# Patient Record
Sex: Female | Born: 1955
Health system: Southern US, Community
[De-identification: ages and names within clinical notes are randomized; demographics above are authoritative.]

---

## 1998-09-14 ENCOUNTER — Inpatient Hospital Stay (HOSPITAL_COMMUNITY): Admission: RE | Admit: 1998-09-14 | Discharge: 1998-09-14 | Payer: Self-pay | Admitting: *Deleted

## 1998-11-29 ENCOUNTER — Ambulatory Visit (HOSPITAL_COMMUNITY): Admission: RE | Admit: 1998-11-29 | Discharge: 1998-11-29 | Payer: Self-pay | Admitting: Obstetrics and Gynecology

## 1999-02-12 ENCOUNTER — Inpatient Hospital Stay (HOSPITAL_COMMUNITY): Admission: AD | Admit: 1999-02-12 | Discharge: 1999-02-14 | Payer: Self-pay | Admitting: Obstetrics and Gynecology

## 2001-06-20 ENCOUNTER — Other Ambulatory Visit: Admission: RE | Admit: 2001-06-20 | Discharge: 2001-06-20 | Payer: Self-pay | Admitting: Obstetrics and Gynecology

## 2001-08-08 ENCOUNTER — Ambulatory Visit (HOSPITAL_COMMUNITY): Admission: RE | Admit: 2001-08-08 | Discharge: 2001-08-08 | Payer: Self-pay | Admitting: Obstetrics and Gynecology

## 2001-08-08 ENCOUNTER — Encounter: Payer: Self-pay | Admitting: Obstetrics and Gynecology

## 2002-07-17 ENCOUNTER — Other Ambulatory Visit: Admission: RE | Admit: 2002-07-17 | Discharge: 2002-07-17 | Payer: Self-pay | Admitting: Obstetrics and Gynecology

## 2002-09-23 ENCOUNTER — Ambulatory Visit (HOSPITAL_COMMUNITY): Admission: RE | Admit: 2002-09-23 | Discharge: 2002-09-23 | Payer: Self-pay | Admitting: Obstetrics and Gynecology

## 2002-09-23 ENCOUNTER — Encounter: Payer: Self-pay | Admitting: Obstetrics and Gynecology

## 2003-12-22 ENCOUNTER — Ambulatory Visit (HOSPITAL_COMMUNITY): Admission: RE | Admit: 2003-12-22 | Discharge: 2003-12-22 | Payer: Self-pay | Admitting: Obstetrics and Gynecology

## 2004-01-27 ENCOUNTER — Other Ambulatory Visit: Admission: RE | Admit: 2004-01-27 | Discharge: 2004-01-27 | Payer: Self-pay | Admitting: Obstetrics and Gynecology

## 2004-12-30 ENCOUNTER — Ambulatory Visit (HOSPITAL_COMMUNITY): Admission: RE | Admit: 2004-12-30 | Discharge: 2004-12-30 | Payer: Self-pay | Admitting: Obstetrics and Gynecology

## 2005-03-31 ENCOUNTER — Other Ambulatory Visit: Admission: RE | Admit: 2005-03-31 | Discharge: 2005-03-31 | Payer: Self-pay | Admitting: Internal Medicine

## 2006-01-22 ENCOUNTER — Ambulatory Visit (HOSPITAL_COMMUNITY): Admission: RE | Admit: 2006-01-22 | Discharge: 2006-01-22 | Payer: Self-pay | Admitting: Internal Medicine

## 2006-04-11 ENCOUNTER — Encounter: Payer: Self-pay | Admitting: *Deleted

## 2006-08-10 ENCOUNTER — Other Ambulatory Visit: Admission: RE | Admit: 2006-08-10 | Discharge: 2006-08-10 | Payer: Self-pay | Admitting: Internal Medicine

## 2007-01-30 ENCOUNTER — Ambulatory Visit (HOSPITAL_COMMUNITY): Admission: RE | Admit: 2007-01-30 | Discharge: 2007-01-30 | Payer: Self-pay | Admitting: *Deleted

## 2007-09-16 ENCOUNTER — Other Ambulatory Visit: Admission: RE | Admit: 2007-09-16 | Discharge: 2007-09-16 | Payer: Self-pay | Admitting: Family Medicine

## 2008-02-06 ENCOUNTER — Ambulatory Visit (HOSPITAL_COMMUNITY): Admission: RE | Admit: 2008-02-06 | Discharge: 2008-02-06 | Payer: Self-pay | Admitting: *Deleted

## 2008-09-02 ENCOUNTER — Ambulatory Visit (HOSPITAL_COMMUNITY): Admission: RE | Admit: 2008-09-02 | Discharge: 2008-09-02 | Payer: Self-pay | Admitting: *Deleted

## 2008-10-13 ENCOUNTER — Other Ambulatory Visit: Admission: RE | Admit: 2008-10-13 | Discharge: 2008-10-13 | Payer: Self-pay | Admitting: Internal Medicine

## 2009-02-09 ENCOUNTER — Ambulatory Visit (HOSPITAL_COMMUNITY): Admission: RE | Admit: 2009-02-09 | Discharge: 2009-02-09 | Payer: Self-pay | Admitting: Internal Medicine

## 2009-10-18 ENCOUNTER — Other Ambulatory Visit: Admission: RE | Admit: 2009-10-18 | Discharge: 2009-10-18 | Payer: Self-pay | Admitting: Internal Medicine

## 2009-10-25 ENCOUNTER — Inpatient Hospital Stay (HOSPITAL_COMMUNITY): Admission: AD | Admit: 2009-10-25 | Discharge: 2009-10-26 | Payer: Self-pay | Admitting: Obstetrics & Gynecology

## 2009-11-17 ENCOUNTER — Other Ambulatory Visit: Admission: RE | Admit: 2009-11-17 | Discharge: 2009-11-17 | Payer: Self-pay | Admitting: Obstetrics and Gynecology

## 2009-11-17 ENCOUNTER — Ambulatory Visit: Payer: Self-pay | Admitting: Obstetrics and Gynecology

## 2009-12-01 ENCOUNTER — Ambulatory Visit: Payer: Self-pay | Admitting: Obstetrics and Gynecology

## 2010-03-15 ENCOUNTER — Ambulatory Visit (HOSPITAL_COMMUNITY): Admission: RE | Admit: 2010-03-15 | Discharge: 2010-03-15 | Payer: Self-pay | Admitting: Internal Medicine

## 2010-06-01 ENCOUNTER — Ambulatory Visit: Payer: Self-pay | Admitting: Obstetrics and Gynecology

## 2010-11-10 ENCOUNTER — Other Ambulatory Visit
Admission: RE | Admit: 2010-11-10 | Discharge: 2010-11-10 | Payer: Self-pay | Source: Home / Self Care | Admitting: Registered Nurse

## 2011-02-27 ENCOUNTER — Other Ambulatory Visit (HOSPITAL_COMMUNITY): Payer: Self-pay | Admitting: Internal Medicine

## 2011-02-27 DIAGNOSIS — Z1231 Encounter for screening mammogram for malignant neoplasm of breast: Secondary | ICD-10-CM

## 2011-03-01 ENCOUNTER — Other Ambulatory Visit: Payer: Self-pay | Admitting: Neurosurgery

## 2011-03-01 DIAGNOSIS — M549 Dorsalgia, unspecified: Secondary | ICD-10-CM

## 2011-03-10 ENCOUNTER — Other Ambulatory Visit: Payer: 59

## 2011-03-14 LAB — POCT PREGNANCY, URINE: Preg Test, Ur: NEGATIVE

## 2011-03-15 LAB — CBC
Hemoglobin: 12.9 g/dL (ref 12.0–15.0)
MCHC: 33.3 g/dL (ref 30.0–36.0)
RBC: 4.15 MIL/uL (ref 3.87–5.11)
RDW: 12.3 % (ref 11.5–15.5)
WBC: 6.6 10*3/uL (ref 4.0–10.5)

## 2011-03-15 LAB — SAMPLE TO BLOOD BANK

## 2011-03-21 ENCOUNTER — Ambulatory Visit (HOSPITAL_COMMUNITY): Payer: 59

## 2011-04-04 ENCOUNTER — Ambulatory Visit (HOSPITAL_COMMUNITY)
Admission: RE | Admit: 2011-04-04 | Discharge: 2011-04-04 | Disposition: A | Discharge status: Home / Self Care | Payer: 59 | Source: Ambulatory Visit | Attending: Internal Medicine | Admitting: Internal Medicine

## 2011-04-04 DIAGNOSIS — Z1231 Encounter for screening mammogram for malignant neoplasm of breast: Secondary | ICD-10-CM | POA: Insufficient documentation

## 2011-04-15 IMAGING — US US PELVIS COMPLETE MODIFY
1 series · 13 of 25 positions shown · non-contrast
Comparison: None.

CLINICAL DATA: Vaginal bleeding.  Polyp identified on physical
examination at the cervical loss.  LMP 10/19/2009.

TRANSABDOMINAL AND TRANSVAGINAL ULTRASOUND OF PELVIS 10/25/2009:
TECHNIQUE: Both transabdominal and transvaginal ultrasound
examinations of the pelvis were performed including evaluation of
the uterus, ovaries, adnexal regions, and pelvic cul-de-sac.

[Series 1: us transvaginal non-ob · 0.27mm/px · 13 of 47 slices shown]
[im 1/47]
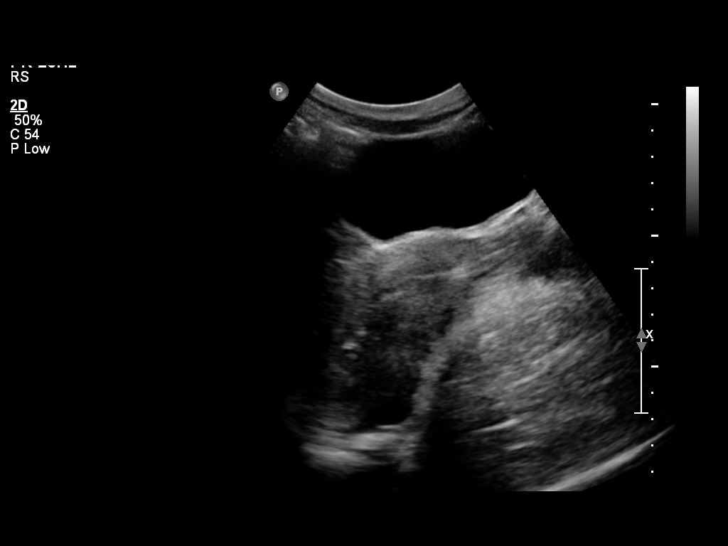
[im 4/47]
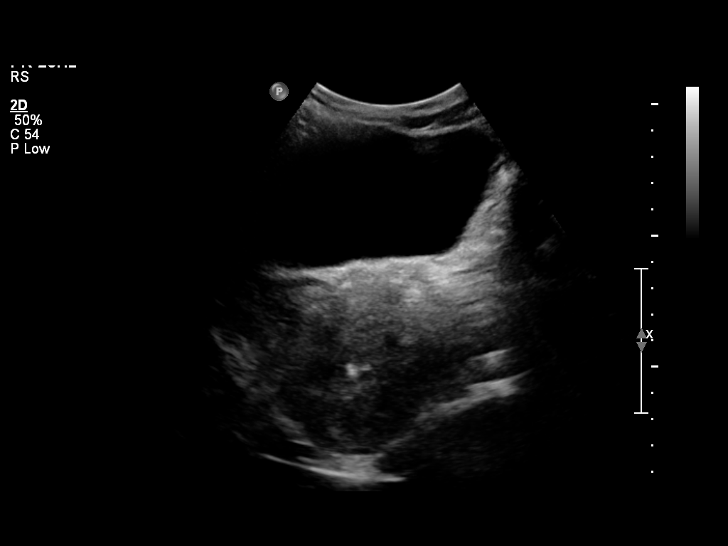
[im 8/47]
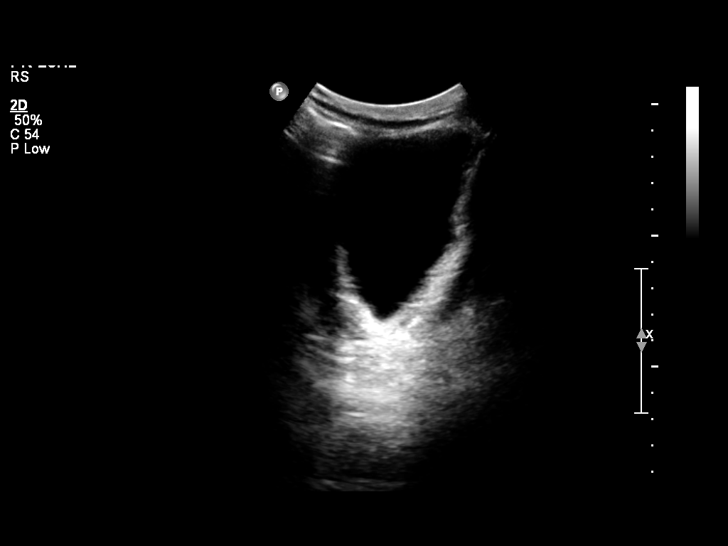
[im 12/47]
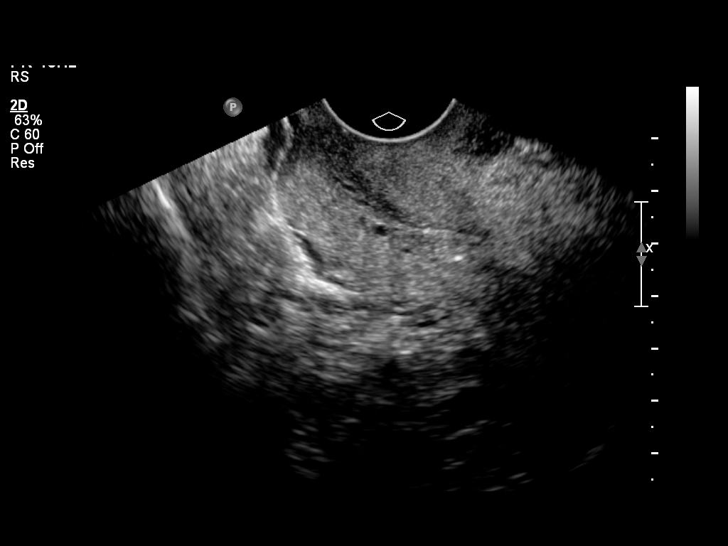
[im 16/47]
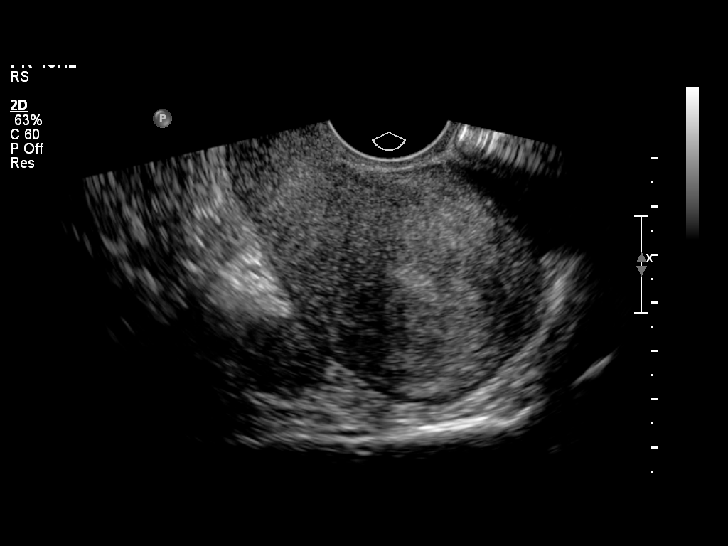
[im 20/47]
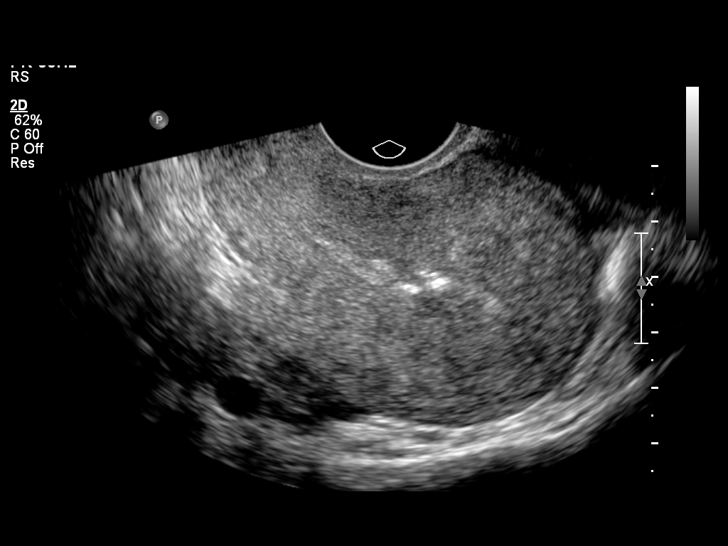
[im 24/47]
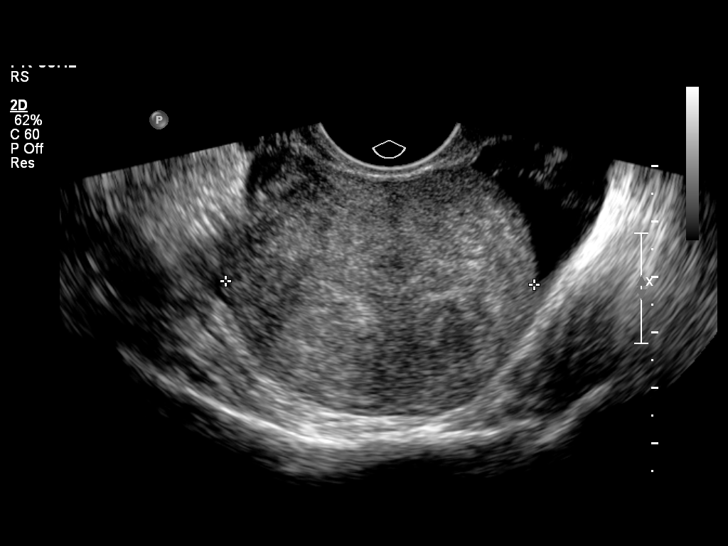
[im 27/47]
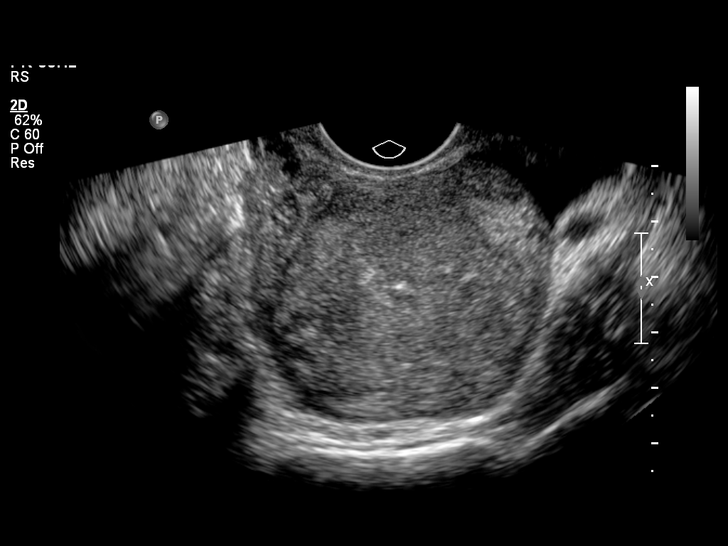
[im 31/47]
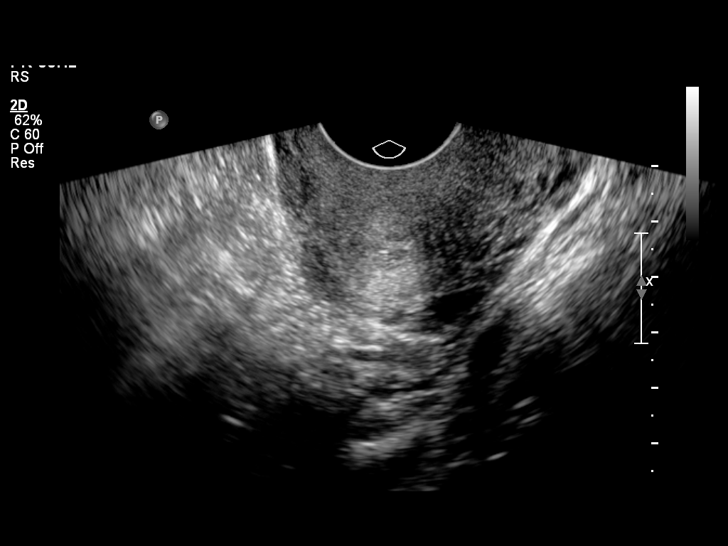
[im 35/47]
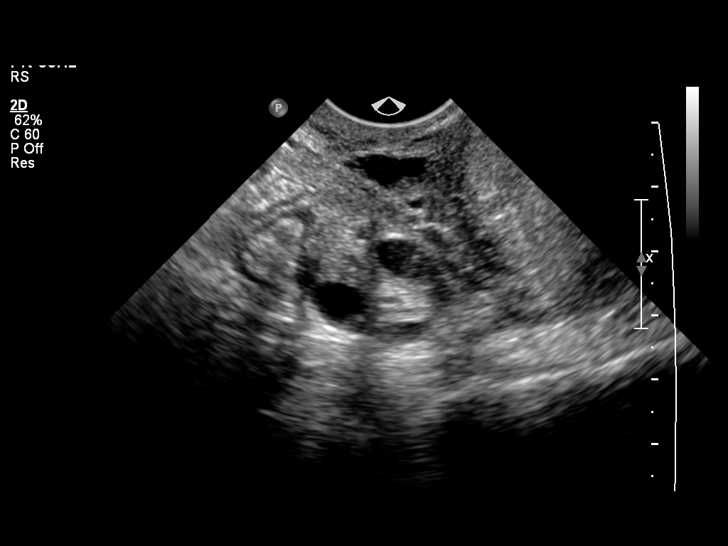
[im 39/47]
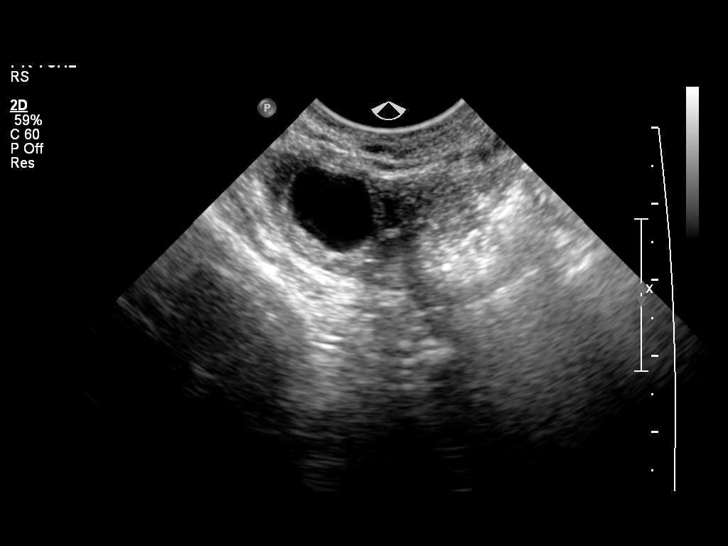
[im 43/47]
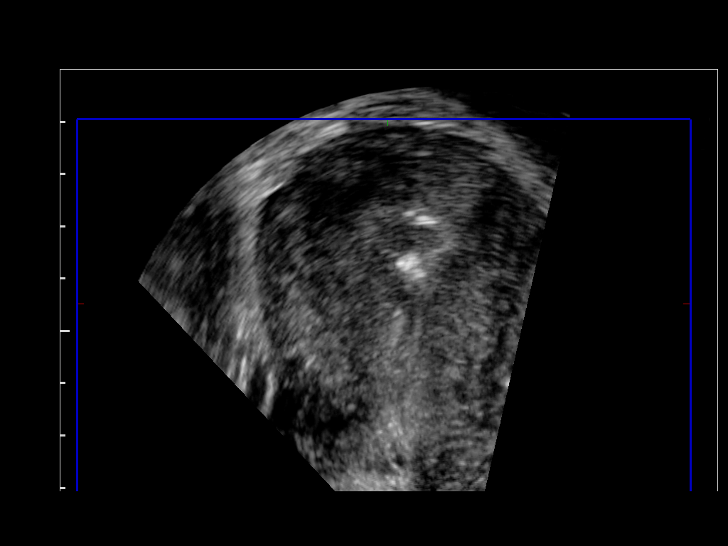
[im 47/47]
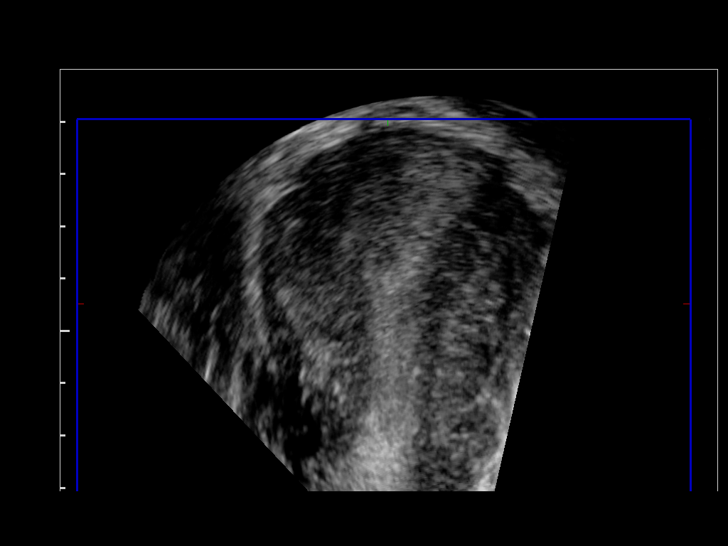

[13 of 25 positions shown; findings below may reference images not displayed]

FINDINGS: Uterus upper normal in size measuring approximately 9.0 x 4.6 x
cm.  Heterogeneous echotexture diffusely involving the myometrium
without discrete fibroid.

Endometrium normal in thickness measuring approximately 9 mm.
Echogenic foci within the endometrial canal throughout.  No
endometrial fluid.  No endometrial polyps.

Right Ovary normal in size measuring approximately 2.4 x 1.3 x
cm, containing several small follicles.

Left Ovary normal in size measuring approximate 2.8 x 1.5 x 1.7 cm,
containing several small follicles.

Other Findings:  Small amount of likely physiologic free fluid in
the cul-de-sac.
IMPRESSION: 1.  Echogenic foci in the endometrial canal which have a
sonographic appearance suggesting air bubbles.  Has there been
recent endometrial instrumentation?
2.  No endometrial fluid or polyps.
3.  Heterogeneous uterine myometrium without discrete fibroids.
4.  Normal-appearing ovaries bilaterally.

## 2011-04-25 NOTE — Op Note (Signed)
NAMERUDEAN, ICENHOUR              ACCOUNT NO.:  [id number]   MEDICAL RECORD NO.:  [id number]          PATIENT TYPE:  AMB   LOCATION:  ENDO                         FACILITY:  [facility]   PHYSICIAN:  Georgiana Spinner, M.D.    DATE OF BIRTH:  1956/01/09   DATE OF PROCEDURE:  09/02/2008  DATE OF DISCHARGE:                               OPERATIVE REPORT   PROCEDURE:  Colonoscopy.   INDICATIONS:  Colon cancer screening.   ANESTHESIA:  Fentanyl 75 mcg, Versed 7.5 mg.   PROCEDURE:  With the patient mildly sedated in the left lateral  decubitus position, the Pentax videoscopic pediatric colonoscope was  inserted in the rectum and passed under direct vision to the cecum,  identified by ileocecal valve and appendiceal orifice, both of which  were photographed.  From this point, the colonoscope was slowly  withdrawn, taking circumferential views of colonic mucosa, stopping in  the rectum which appeared normal on direct and showed hemorrhoids on  retroflexed view.  The endoscope was straightened and withdrawn.  The  patient's vital signs and pulse oximeter remained stable.  The patient  tolerated the procedure well without apparent complications.   FINDINGS:  Internal hemorrhoids.  Otherwise an unremarkable colonoscopic  examination to the cecum.   PLAN:  Consider repeat examination in 5-10 years.           ______________________________  Georgiana Spinner, M.D.     GMO/MEDQ  D:  09/02/2008  T:  09/02/2008  Job:  [id number]

## 2012-04-10 ENCOUNTER — Other Ambulatory Visit (HOSPITAL_COMMUNITY): Payer: Self-pay | Admitting: Internal Medicine

## 2012-04-10 DIAGNOSIS — Z1231 Encounter for screening mammogram for malignant neoplasm of breast: Secondary | ICD-10-CM

## 2012-05-03 ENCOUNTER — Ambulatory Visit (HOSPITAL_COMMUNITY): Payer: 59

## 2012-07-08 ENCOUNTER — Ambulatory Visit (HOSPITAL_COMMUNITY)
Admission: RE | Admit: 2012-07-08 | Discharge: 2012-07-08 | Disposition: A | Discharge status: Home / Self Care | Payer: 59 | Source: Ambulatory Visit | Attending: Internal Medicine | Admitting: Internal Medicine

## 2012-07-08 DIAGNOSIS — Z1231 Encounter for screening mammogram for malignant neoplasm of breast: Secondary | ICD-10-CM

## 2013-01-14 ENCOUNTER — Other Ambulatory Visit (HOSPITAL_COMMUNITY)
Admission: RE | Admit: 2013-01-14 | Discharge: 2013-01-14 | Disposition: A | Discharge status: Home / Self Care | Payer: 59 | Source: Ambulatory Visit | Attending: Internal Medicine | Admitting: Internal Medicine

## 2013-01-14 ENCOUNTER — Other Ambulatory Visit: Payer: Self-pay | Admitting: Internal Medicine

## 2013-01-14 DIAGNOSIS — Z01419 Encounter for gynecological examination (general) (routine) without abnormal findings: Secondary | ICD-10-CM | POA: Insufficient documentation

## 2013-09-11 ENCOUNTER — Other Ambulatory Visit (HOSPITAL_COMMUNITY): Payer: Self-pay | Admitting: Internal Medicine

## 2013-09-11 DIAGNOSIS — Z1231 Encounter for screening mammogram for malignant neoplasm of breast: Secondary | ICD-10-CM

## 2013-09-23 ENCOUNTER — Ambulatory Visit (HOSPITAL_COMMUNITY)
Admission: RE | Admit: 2013-09-23 | Discharge: 2013-09-23 | Disposition: A | Discharge status: Home / Self Care | Payer: 59 | Source: Ambulatory Visit | Attending: Internal Medicine | Admitting: Internal Medicine

## 2013-09-23 DIAGNOSIS — Z1231 Encounter for screening mammogram for malignant neoplasm of breast: Secondary | ICD-10-CM

## 2014-12-29 ENCOUNTER — Other Ambulatory Visit (HOSPITAL_COMMUNITY): Payer: Self-pay | Admitting: Internal Medicine

## 2014-12-29 DIAGNOSIS — Z1231 Encounter for screening mammogram for malignant neoplasm of breast: Secondary | ICD-10-CM

## 2015-01-13 ENCOUNTER — Ambulatory Visit (HOSPITAL_COMMUNITY)
Admission: RE | Admit: 2015-01-13 | Discharge: 2015-01-13 | Disposition: A | Discharge status: Home / Self Care | Payer: 59 | Source: Ambulatory Visit | Attending: Internal Medicine | Admitting: Internal Medicine

## 2015-01-13 DIAGNOSIS — Z1231 Encounter for screening mammogram for malignant neoplasm of breast: Secondary | ICD-10-CM | POA: Insufficient documentation

## 2015-01-15 ENCOUNTER — Other Ambulatory Visit: Payer: Self-pay | Admitting: Internal Medicine

## 2015-01-15 DIAGNOSIS — R928 Other abnormal and inconclusive findings on diagnostic imaging of breast: Secondary | ICD-10-CM

## 2015-01-28 ENCOUNTER — Ambulatory Visit
Admission: RE | Admit: 2015-01-28 | Discharge: 2015-01-28 | Disposition: A | Discharge status: Home / Self Care | Payer: 59 | Source: Ambulatory Visit | Attending: Internal Medicine | Admitting: Internal Medicine

## 2015-01-28 DIAGNOSIS — R928 Other abnormal and inconclusive findings on diagnostic imaging of breast: Secondary | ICD-10-CM

## 2015-08-19 ENCOUNTER — Other Ambulatory Visit: Payer: Self-pay | Admitting: Internal Medicine

## 2015-08-19 DIAGNOSIS — N6320 Unspecified lump in the left breast, unspecified quadrant: Secondary | ICD-10-CM

## 2015-09-08 ENCOUNTER — Ambulatory Visit
Admission: RE | Admit: 2015-09-08 | Discharge: 2015-09-08 | Disposition: A | Discharge status: Home / Self Care | Payer: 59 | Source: Ambulatory Visit | Attending: Internal Medicine | Admitting: Internal Medicine

## 2015-09-08 DIAGNOSIS — N6320 Unspecified lump in the left breast, unspecified quadrant: Secondary | ICD-10-CM

## 2016-01-11 MED FILL — HYDROCHLOROTHIAZIDE 25 MG T: 25 | 90 days supply | Qty: 90 | Fill #0

## 2016-02-18 DIAGNOSIS — H524 Presbyopia: Secondary | ICD-10-CM | POA: Diagnosis not present

## 2016-02-18 DIAGNOSIS — H52223 Regular astigmatism, bilateral: Secondary | ICD-10-CM | POA: Diagnosis not present

## 2016-02-18 DIAGNOSIS — H5203 Hypermetropia, bilateral: Secondary | ICD-10-CM | POA: Diagnosis not present

## 2016-03-01 ENCOUNTER — Other Ambulatory Visit: Payer: Self-pay | Admitting: Internal Medicine

## 2016-03-01 DIAGNOSIS — N6002 Solitary cyst of left breast: Secondary | ICD-10-CM

## 2016-03-07 DIAGNOSIS — Z0000 Encounter for general adult medical examination without abnormal findings: Secondary | ICD-10-CM | POA: Diagnosis not present

## 2016-03-07 DIAGNOSIS — I10 Essential (primary) hypertension: Secondary | ICD-10-CM | POA: Diagnosis not present

## 2016-03-08 ENCOUNTER — Ambulatory Visit
Admission: RE | Admit: 2016-03-08 | Discharge: 2016-03-08 | Disposition: A | Discharge status: Home / Self Care | Payer: 59 | Source: Ambulatory Visit | Attending: Internal Medicine | Admitting: Internal Medicine

## 2016-03-08 DIAGNOSIS — N6002 Solitary cyst of left breast: Secondary | ICD-10-CM

## 2016-03-08 DIAGNOSIS — R928 Other abnormal and inconclusive findings on diagnostic imaging of breast: Secondary | ICD-10-CM | POA: Diagnosis not present

## 2016-03-14 ENCOUNTER — Other Ambulatory Visit (HOSPITAL_COMMUNITY)
Admission: RE | Admit: 2016-03-14 | Discharge: 2016-03-14 | Disposition: A | Discharge status: Home / Self Care | Payer: 59 | Source: Ambulatory Visit | Attending: Internal Medicine | Admitting: Internal Medicine

## 2016-03-14 ENCOUNTER — Other Ambulatory Visit: Payer: Self-pay | Admitting: Internal Medicine

## 2016-03-14 DIAGNOSIS — I10 Essential (primary) hypertension: Secondary | ICD-10-CM | POA: Diagnosis not present

## 2016-03-14 DIAGNOSIS — Z01419 Encounter for gynecological examination (general) (routine) without abnormal findings: Secondary | ICD-10-CM | POA: Diagnosis not present

## 2016-03-14 DIAGNOSIS — Z0000 Encounter for general adult medical examination without abnormal findings: Secondary | ICD-10-CM | POA: Diagnosis not present

## 2016-03-16 LAB — CYTOLOGY - PAP

## 2016-04-05 MED FILL — HYDROCHLOROTHIAZIDE 25 MG T: 25 | 90 days supply | Qty: 90 | Fill #1

## 2016-07-14 MED FILL — HYDROCHLOROTHIAZIDE 25 MG T: 25 | 90 days supply | Qty: 90 | Fill #2

## 2016-07-24 MED FILL — AMOXICILLIN 875 MG TABLET: 875 | 10 days supply | Qty: 20 | Fill #0

## 2016-11-06 MED FILL — HYDROCHLOROTHIAZIDE 25 MG T: 25 | 90 days supply | Qty: 90 | Fill #0

## 2017-01-24 MED FILL — HYDROCHLOROTHIAZIDE 25 MG T: 25 | 90 days supply | Qty: 90 | Fill #1

## 2017-03-13 ENCOUNTER — Other Ambulatory Visit: Payer: Self-pay | Admitting: Internal Medicine

## 2017-03-13 DIAGNOSIS — Z1231 Encounter for screening mammogram for malignant neoplasm of breast: Secondary | ICD-10-CM

## 2017-04-03 DIAGNOSIS — Z0000 Encounter for general adult medical examination without abnormal findings: Secondary | ICD-10-CM | POA: Diagnosis not present

## 2017-04-03 DIAGNOSIS — N390 Urinary tract infection, site not specified: Secondary | ICD-10-CM | POA: Diagnosis not present

## 2017-04-05 ENCOUNTER — Ambulatory Visit
Admission: RE | Admit: 2017-04-05 | Discharge: 2017-04-05 | Disposition: A | Discharge status: Home / Self Care | Payer: 59 | Source: Ambulatory Visit | Attending: Internal Medicine | Admitting: Internal Medicine

## 2017-04-05 DIAGNOSIS — Z1231 Encounter for screening mammogram for malignant neoplasm of breast: Secondary | ICD-10-CM | POA: Diagnosis not present

## 2017-04-10 DIAGNOSIS — I10 Essential (primary) hypertension: Secondary | ICD-10-CM | POA: Diagnosis not present

## 2017-04-10 DIAGNOSIS — R1033 Periumbilical pain: Secondary | ICD-10-CM | POA: Diagnosis not present

## 2017-04-10 DIAGNOSIS — Z0000 Encounter for general adult medical examination without abnormal findings: Secondary | ICD-10-CM | POA: Diagnosis not present

## 2017-04-16 MED FILL — HYDROCHLOROTHIAZIDE 25 MG T: 25 | 90 days supply | Qty: 90 | Fill #2

## 2017-07-11 MED FILL — HYDROCHLOROTHIAZIDE 25 MG T: 25 | 90 days supply | Qty: 90 | Fill #0

## 2017-11-08 MED FILL — HYDROCHLOROTHIAZIDE 25 MG T: 25 | 90 days supply | Qty: 90 | Fill #1

## 2018-01-22 MED FILL — NAPROXEN SODIUM 550 MG TAB: 550 | 5 days supply | Qty: 15 | Fill #0

## 2018-01-22 MED FILL — CHLORHEXIDINE 0.12% RINSE: 0.12 | 16 days supply | Qty: 473 | Fill #0

## 2018-02-01 MED FILL — HYDROCHLOROTHIAZIDE 25 MG T: 25 | 90 days supply | Qty: 90 | Fill #2

## 2018-02-07 MED FILL — CHLORHEXIDINE 0.12% RINSE: 0.12 | 16 days supply | Qty: 473 | Fill #1

## 2018-04-30 MED FILL — HYDROCHLOROTHIAZIDE 25 MG T: 25 | 90 days supply | Qty: 90 | Fill #0

## 2018-05-01 ENCOUNTER — Other Ambulatory Visit: Payer: Self-pay | Admitting: Internal Medicine

## 2018-05-01 DIAGNOSIS — Z1231 Encounter for screening mammogram for malignant neoplasm of breast: Secondary | ICD-10-CM

## 2018-05-23 ENCOUNTER — Ambulatory Visit
Admission: RE | Admit: 2018-05-23 | Discharge: 2018-05-23 | Disposition: A | Discharge status: Home / Self Care | Payer: Self-pay | Source: Ambulatory Visit | Attending: Internal Medicine | Admitting: Internal Medicine

## 2018-05-23 DIAGNOSIS — Z1231 Encounter for screening mammogram for malignant neoplasm of breast: Secondary | ICD-10-CM

## 2018-07-25 MED FILL — HYDROCHLOROTHIAZIDE 25 MG T: 25 | 90 days supply | Qty: 90 | Fill #1

## 2018-10-24 MED FILL — HYDROCHLOROTHIAZIDE 25 MG T: 25 | 90 days supply | Qty: 90 | Fill #2

## 2019-01-17 MED FILL — HYDROCHLOROTHIAZIDE 25 MG T: 25 | 90 days supply | Qty: 90 | Fill #3

## 2019-04-15 MED FILL — HYDROCHLOROTHIAZIDE 25 MG T: 25 | 90 days supply | Qty: 90 | Fill #0

## 2019-07-09 ENCOUNTER — Other Ambulatory Visit: Payer: Self-pay | Admitting: Internal Medicine

## 2019-07-09 DIAGNOSIS — Z1231 Encounter for screening mammogram for malignant neoplasm of breast: Secondary | ICD-10-CM

## 2019-07-22 MED FILL — HYDROCHLOROTHIAZIDE 25 MG T: 25 | 90 days supply | Qty: 90 | Fill #1

## 2019-08-26 ENCOUNTER — Other Ambulatory Visit: Payer: Self-pay

## 2019-08-26 ENCOUNTER — Ambulatory Visit
Admission: RE | Admit: 2019-08-26 | Discharge: 2019-08-26 | Disposition: A | Discharge status: Home / Self Care | Payer: No Typology Code available for payment source | Source: Ambulatory Visit | Attending: Internal Medicine | Admitting: Internal Medicine

## 2019-08-26 DIAGNOSIS — Z1231 Encounter for screening mammogram for malignant neoplasm of breast: Secondary | ICD-10-CM

## 2019-10-21 MED FILL — HYDROCHLOROTHIAZIDE 25 MG T: 25 | 90 days supply | Qty: 90 | Fill #2

## 2019-10-21 MED FILL — DENTA 5000 PLUS CREAM: 1.1 | 90 days supply | Qty: 102 | Fill #0

## 2020-01-09 MED FILL — HYDROCHLOROTHIAZIDE 25 MG T: 25 | 90 days supply | Qty: 90 | Fill #3

## 2020-04-21 ENCOUNTER — Other Ambulatory Visit (HOSPITAL_COMMUNITY): Payer: Self-pay | Admitting: Internal Medicine

## 2020-04-29 MED FILL — SHINGRIX 50 MCG SUS: 50 | 1 days supply | Qty: 1 | Fill #0

## 2020-06-24 LAB — EXTERNAL GENERIC LAB PROCEDURE: COLOGUARD: NEGATIVE

## 2020-06-24 LAB — COLOGUARD: COLOGUARD: NEGATIVE

## 2020-07-04 MED FILL — SHINGRIX 50 MCG SUS: 50 | 1 days supply | Qty: 1 | Fill #1

## 2020-07-19 MED FILL — SHINGRIX 50 MCG SUS: 50 | 1 days supply | Qty: 1 | Fill #1

## 2020-07-19 MED FILL — HYDROCHLOROTHIAZIDE 25 MG T: 25 | 90 days supply | Qty: 90 | Fill #1

## 2020-08-24 ENCOUNTER — Other Ambulatory Visit: Payer: Self-pay | Admitting: Internal Medicine

## 2020-08-24 DIAGNOSIS — Z1231 Encounter for screening mammogram for malignant neoplasm of breast: Secondary | ICD-10-CM

## 2020-08-31 MED FILL — FLUARIX QUADRIVALENT 0.5 ML: 0.5 | 1 days supply | Qty: 1 | Fill #0

## 2020-08-31 MED FILL — predniSONE 10 MG TABS: 10 | 10 days supply | Qty: 21 | Fill #0

## 2020-09-15 ENCOUNTER — Other Ambulatory Visit: Payer: Self-pay

## 2020-09-15 ENCOUNTER — Ambulatory Visit
Admission: RE | Admit: 2020-09-15 | Discharge: 2020-09-15 | Disposition: A | Discharge status: Home / Self Care | Payer: No Typology Code available for payment source | Source: Ambulatory Visit | Attending: Internal Medicine | Admitting: Internal Medicine

## 2020-09-15 DIAGNOSIS — Z1231 Encounter for screening mammogram for malignant neoplasm of breast: Secondary | ICD-10-CM

## 2020-10-18 MED FILL — HYDROCHLOROTHIAZIDE 25 MG T: 25 | 90 days supply | Qty: 90 | Fill #2

## 2021-01-24 ENCOUNTER — Other Ambulatory Visit (HOSPITAL_COMMUNITY): Payer: Self-pay | Admitting: Internal Medicine

## 2021-01-24 MED FILL — HYDROCHLOROTHIAZIDE 25 MG T: 25 | 90 days supply | Qty: 90 | Fill #0

## 2021-02-13 IMAGING — MG MM DIGITAL SCREENING BILAT W/ TOMO W/ CAD
8 series · 8 of 24 positions shown · non-contrast
Comparison: Previous exam(s).

CLINICAL DATA: Screening.

EXAM:
DIGITAL SCREENING BILATERAL MAMMOGRAM WITH TOMO AND CAD

[L CC synth-2D]
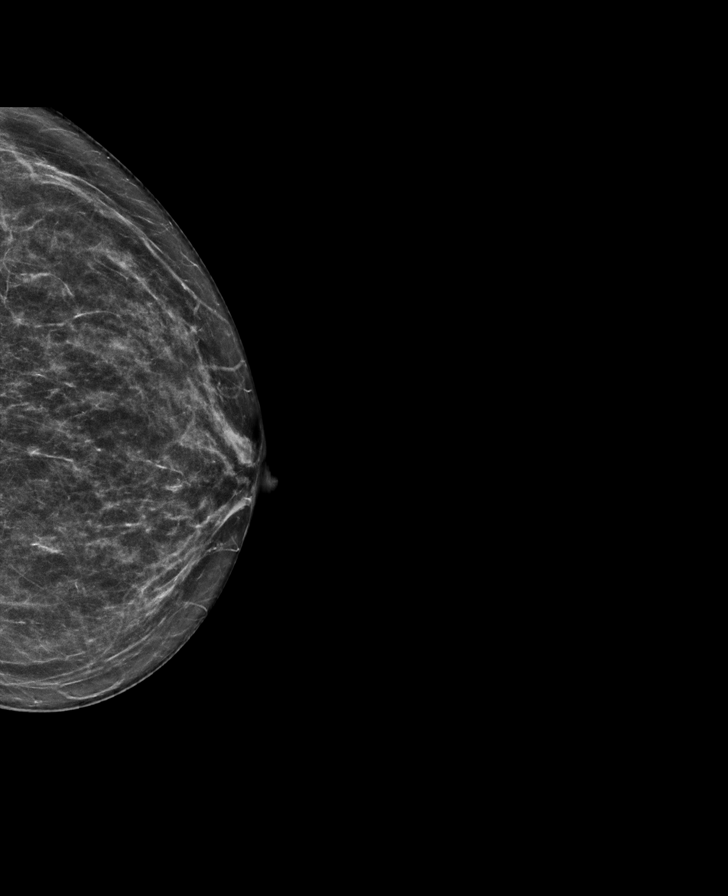

[R MLO synth-2D]
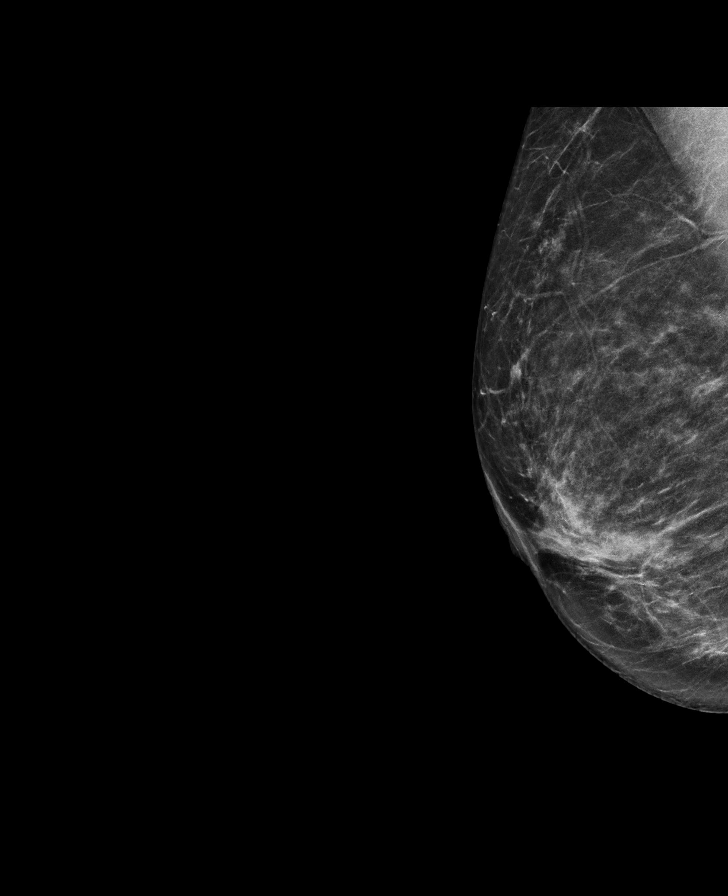

[R CC synth-2D]
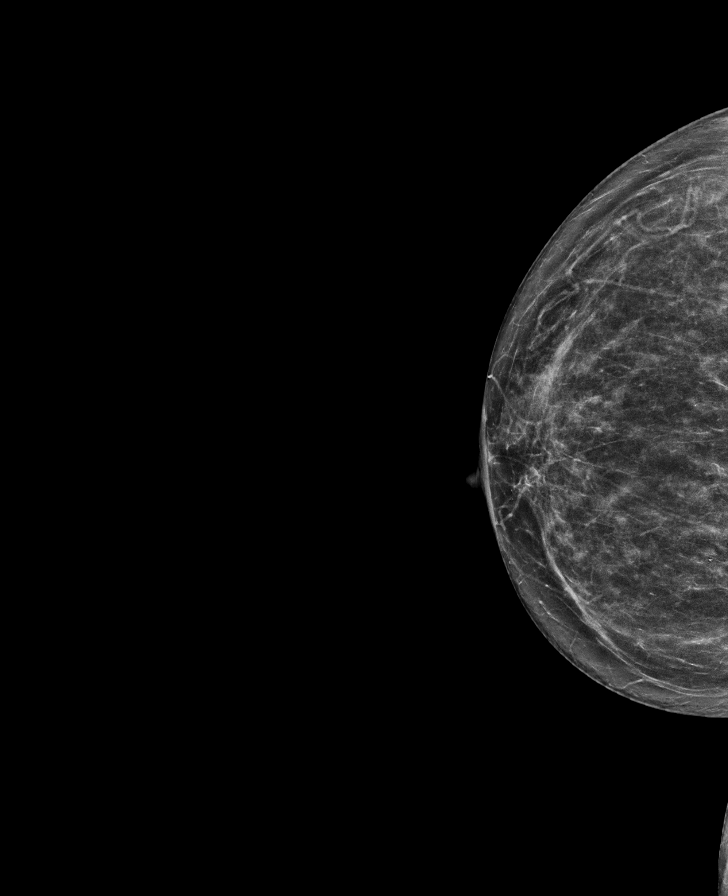

[L MLO synth-2D]
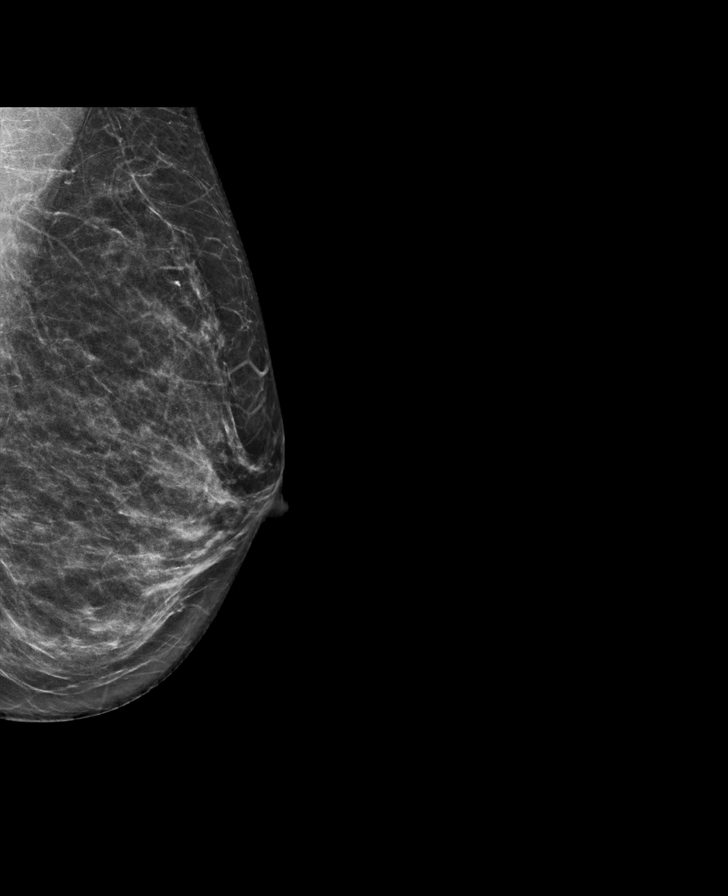

[R CC tomo · tomo slice 35/69.0]
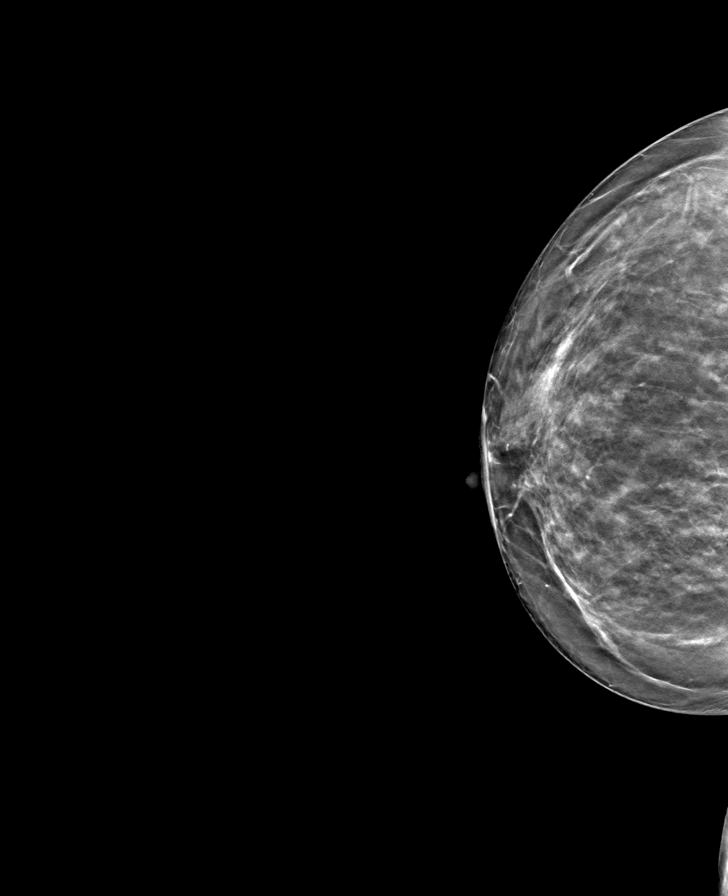

[L CC tomo · tomo slice 33/64.0]
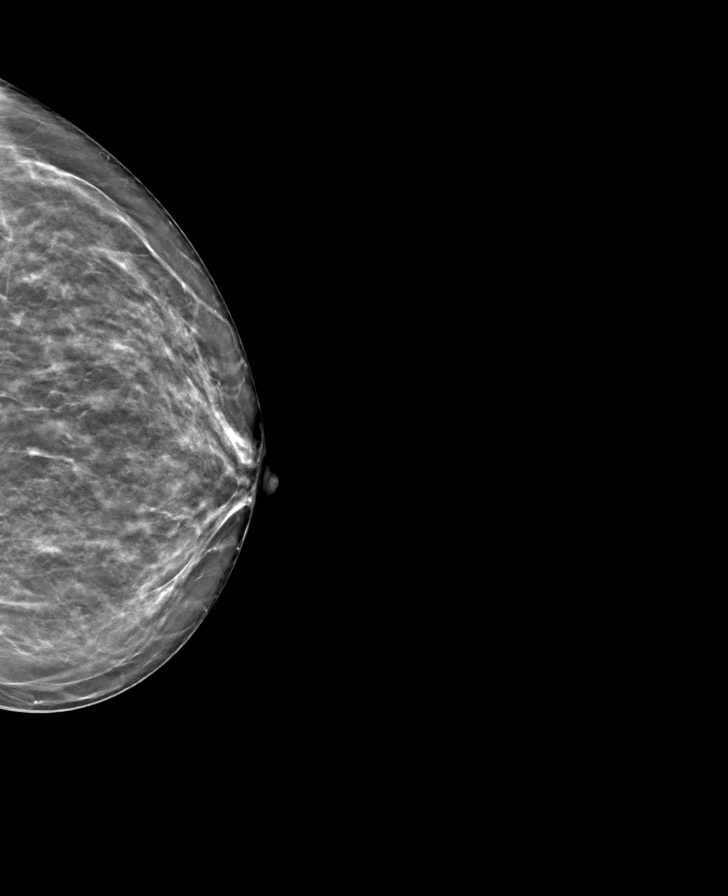

[L MLO tomo · tomo slice 32/63.0]
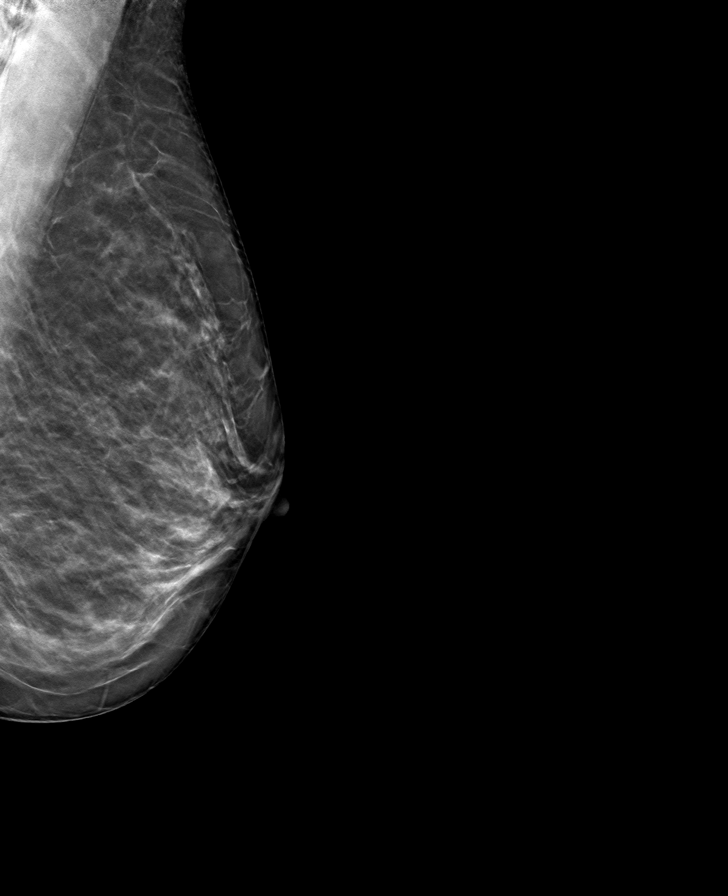

[R MLO tomo · tomo slice 35/69.0]
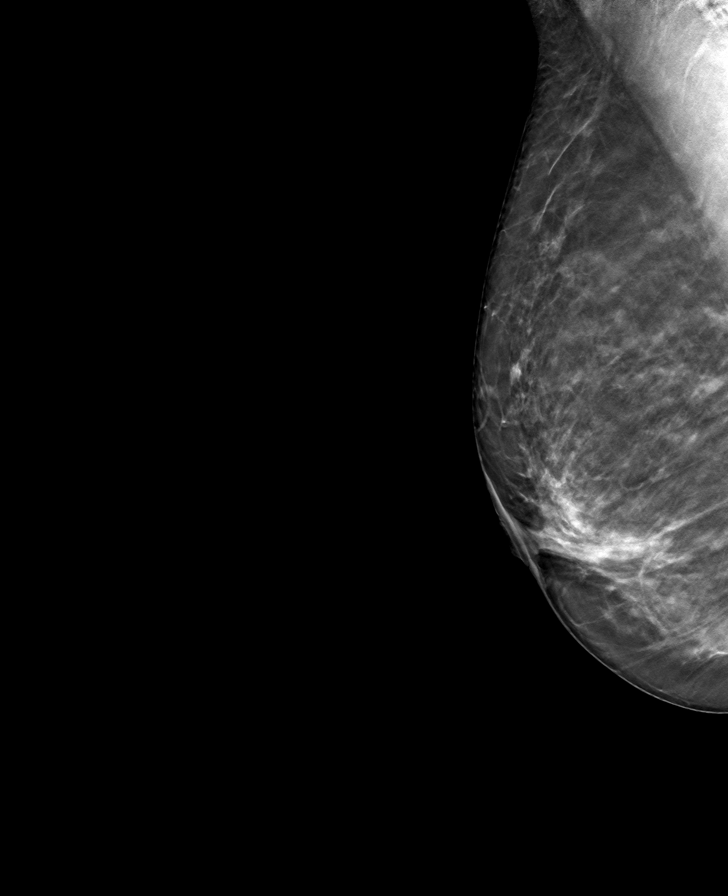

[8 of 24 positions shown; findings below may reference images not displayed]

ACR Breast Density Category c: The breast tissue is heterogeneously
dense, which may obscure small masses.
FINDINGS: There are no findings suspicious for malignancy. Images were
processed with CAD.
IMPRESSION: No mammographic evidence of malignancy. A result letter of this
screening mammogram will be mailed directly to the patient.

RECOMMENDATION:
Screening mammogram in one year. (Code:FT-U-LHB)

BI-RADS CATEGORY  1: Negative.

## 2021-03-03 ENCOUNTER — Other Ambulatory Visit (HOSPITAL_BASED_OUTPATIENT_CLINIC_OR_DEPARTMENT_OTHER): Payer: Self-pay

## 2021-04-22 ENCOUNTER — Other Ambulatory Visit (HOSPITAL_COMMUNITY): Payer: Self-pay

## 2021-04-22 MED FILL — Hydrochlorothiazide Tab 25 MG: ORAL | 90 days supply | Qty: 90 | Fill #0 | Status: AC

## 2021-06-21 ENCOUNTER — Other Ambulatory Visit (HOSPITAL_COMMUNITY): Payer: Self-pay

## 2021-06-21 DIAGNOSIS — R519 Headache, unspecified: Secondary | ICD-10-CM | POA: Diagnosis not present

## 2021-06-21 DIAGNOSIS — M25562 Pain in left knee: Secondary | ICD-10-CM | POA: Diagnosis not present

## 2021-06-21 DIAGNOSIS — Z0000 Encounter for general adult medical examination without abnormal findings: Secondary | ICD-10-CM | POA: Diagnosis not present

## 2021-06-21 DIAGNOSIS — M159 Polyosteoarthritis, unspecified: Secondary | ICD-10-CM | POA: Diagnosis not present

## 2021-06-21 DIAGNOSIS — I10 Essential (primary) hypertension: Secondary | ICD-10-CM | POA: Diagnosis not present

## 2021-06-21 DIAGNOSIS — T148XXA Other injury of unspecified body region, initial encounter: Secondary | ICD-10-CM | POA: Diagnosis not present

## 2021-06-21 MED ORDER — MELOXICAM 15 MG PO TABS
15.0000 mg | ORAL_TABLET | Freq: Every day | ORAL | 0 refills | Status: DC
  Filled 2021-06-21: qty 30, 30d supply, fill #0

## 2021-07-01 ENCOUNTER — Other Ambulatory Visit (HOSPITAL_COMMUNITY): Payer: Self-pay

## 2021-07-29 ENCOUNTER — Other Ambulatory Visit (HOSPITAL_COMMUNITY): Payer: Self-pay

## 2021-07-29 MED FILL — Hydrochlorothiazide Tab 25 MG: ORAL | 90 days supply | Qty: 90 | Fill #1 | Status: AC

## 2021-08-26 ENCOUNTER — Other Ambulatory Visit: Payer: Self-pay | Admitting: Internal Medicine

## 2021-08-26 DIAGNOSIS — Z1231 Encounter for screening mammogram for malignant neoplasm of breast: Secondary | ICD-10-CM

## 2021-09-28 ENCOUNTER — Ambulatory Visit
Admission: RE | Admit: 2021-09-28 | Discharge: 2021-09-28 | Disposition: A | Discharge status: Home / Self Care | Payer: 59 | Source: Ambulatory Visit | Attending: Internal Medicine | Admitting: Internal Medicine

## 2021-09-28 ENCOUNTER — Other Ambulatory Visit: Payer: Self-pay

## 2021-09-28 DIAGNOSIS — Z1231 Encounter for screening mammogram for malignant neoplasm of breast: Secondary | ICD-10-CM | POA: Diagnosis not present

## 2021-10-13 DIAGNOSIS — H5203 Hypermetropia, bilateral: Secondary | ICD-10-CM | POA: Diagnosis not present

## 2021-10-13 DIAGNOSIS — H52203 Unspecified astigmatism, bilateral: Secondary | ICD-10-CM | POA: Diagnosis not present

## 2021-10-25 ENCOUNTER — Ambulatory Visit: Payer: 59 | Attending: Internal Medicine

## 2021-10-25 ENCOUNTER — Other Ambulatory Visit (HOSPITAL_BASED_OUTPATIENT_CLINIC_OR_DEPARTMENT_OTHER): Payer: Self-pay

## 2021-10-25 DIAGNOSIS — Z23 Encounter for immunization: Secondary | ICD-10-CM

## 2021-10-25 MED ORDER — PFIZER COVID-19 VAC BIVALENT 30 MCG/0.3ML IM SUSP
INTRAMUSCULAR | 0 refills | Status: AC
  Filled 2021-10-25: qty 0.3, 1d supply, fill #0

## 2021-10-25 NOTE — Progress Notes (Signed)
   Covid-19 Vaccination Clinic  Name:  Jennifer Travis    MRN: [id number] DOB: [birth year]  10/25/2021  Jennifer Travis was observed post Covid-19 immunization for 15 minutes without incident. She was provided with Vaccine Information Sheet and instruction to access the V-Safe system.   Jennifer Travis was instructed to call 911 with any severe reactions post vaccine: Difficulty breathing  Swelling of face and throat  A fast heartbeat  A bad rash all over body  Dizziness and weakness   Immunizations Administered     Name Date Dose VIS Date Route   Pfizer Covid-19 Vaccine Bivalent Booster 10/25/2021  1:42 PM 0.3 mL 08/10/2021 Intramuscular   Manufacturer: [employer], [employer]   Lot: [id number]   NDC: [phone]

## 2021-11-02 ENCOUNTER — Other Ambulatory Visit (HOSPITAL_COMMUNITY): Payer: Self-pay

## 2021-11-02 MED ORDER — HYDROCHLOROTHIAZIDE 25 MG PO TABS
25.0000 mg | ORAL_TABLET | Freq: Every day | ORAL | 0 refills | Status: DC
  Filled 2021-11-02 (×2): qty 90, 90d supply, fill #0

## 2022-01-24 ENCOUNTER — Other Ambulatory Visit (HOSPITAL_COMMUNITY): Payer: Self-pay

## 2022-01-25 ENCOUNTER — Other Ambulatory Visit (HOSPITAL_COMMUNITY): Payer: Self-pay

## 2022-01-25 MED ORDER — HYDROCHLOROTHIAZIDE 25 MG PO TABS
25.0000 mg | ORAL_TABLET | Freq: Every day | ORAL | 3 refills | Status: AC
  Filled 2022-01-25: qty 90, 90d supply, fill #0
  Filled 2022-05-01: qty 90, 90d supply, fill #1
  Filled 2022-07-28: qty 90, 90d supply, fill #2
  Filled 2022-10-30: qty 30, 30d supply, fill #3
  Filled 2022-11-04: qty 30, 30d supply, fill #4

## 2022-01-26 ENCOUNTER — Other Ambulatory Visit (HOSPITAL_COMMUNITY): Payer: Self-pay

## 2022-05-01 ENCOUNTER — Other Ambulatory Visit (HOSPITAL_COMMUNITY): Payer: Self-pay

## 2022-07-28 ENCOUNTER — Other Ambulatory Visit (HOSPITAL_COMMUNITY): Payer: Self-pay

## 2022-08-15 DIAGNOSIS — Z0000 Encounter for general adult medical examination without abnormal findings: Secondary | ICD-10-CM | POA: Diagnosis not present

## 2022-08-22 DIAGNOSIS — I10 Essential (primary) hypertension: Secondary | ICD-10-CM | POA: Diagnosis not present

## 2022-08-22 DIAGNOSIS — E782 Mixed hyperlipidemia: Secondary | ICD-10-CM | POA: Diagnosis not present

## 2022-08-22 DIAGNOSIS — Z0000 Encounter for general adult medical examination without abnormal findings: Secondary | ICD-10-CM | POA: Diagnosis not present

## 2022-10-05 ENCOUNTER — Other Ambulatory Visit: Payer: Self-pay | Admitting: Internal Medicine

## 2022-10-05 DIAGNOSIS — Z1231 Encounter for screening mammogram for malignant neoplasm of breast: Secondary | ICD-10-CM

## 2022-10-13 ENCOUNTER — Other Ambulatory Visit: Payer: Self-pay

## 2022-10-30 ENCOUNTER — Other Ambulatory Visit (HOSPITAL_COMMUNITY): Payer: Self-pay

## 2022-11-04 ENCOUNTER — Other Ambulatory Visit (HOSPITAL_COMMUNITY): Payer: Self-pay

## 2022-11-06 ENCOUNTER — Other Ambulatory Visit (HOSPITAL_COMMUNITY): Payer: Self-pay

## 2022-11-30 ENCOUNTER — Ambulatory Visit
Admission: RE | Admit: 2022-11-30 | Discharge: 2022-11-30 | Disposition: A | Discharge status: Home / Self Care | Payer: Self-pay | Source: Ambulatory Visit | Attending: Internal Medicine | Admitting: Internal Medicine

## 2022-11-30 DIAGNOSIS — Z1231 Encounter for screening mammogram for malignant neoplasm of breast: Secondary | ICD-10-CM | POA: Diagnosis not present

## 2023-02-20 DIAGNOSIS — E782 Mixed hyperlipidemia: Secondary | ICD-10-CM | POA: Diagnosis not present

## 2023-02-20 DIAGNOSIS — I10 Essential (primary) hypertension: Secondary | ICD-10-CM | POA: Diagnosis not present

## 2023-02-27 DIAGNOSIS — I10 Essential (primary) hypertension: Secondary | ICD-10-CM | POA: Diagnosis not present

## 2023-02-27 DIAGNOSIS — E782 Mixed hyperlipidemia: Secondary | ICD-10-CM | POA: Diagnosis not present

## 2023-02-27 DIAGNOSIS — Z5320 Procedure and treatment not carried out because of patient's decision for unspecified reasons: Secondary | ICD-10-CM | POA: Diagnosis not present

## 2023-04-10 ENCOUNTER — Other Ambulatory Visit (HOSPITAL_COMMUNITY): Payer: Self-pay

## 2023-07-17 DIAGNOSIS — H5203 Hypermetropia, bilateral: Secondary | ICD-10-CM | POA: Diagnosis not present

## 2023-07-17 DIAGNOSIS — H52203 Unspecified astigmatism, bilateral: Secondary | ICD-10-CM | POA: Diagnosis not present

## 2023-07-17 DIAGNOSIS — H524 Presbyopia: Secondary | ICD-10-CM | POA: Diagnosis not present

## 2023-07-17 DIAGNOSIS — H31002 Unspecified chorioretinal scars, left eye: Secondary | ICD-10-CM | POA: Diagnosis not present

## 2023-08-28 DIAGNOSIS — Z0000 Encounter for general adult medical examination without abnormal findings: Secondary | ICD-10-CM | POA: Diagnosis not present

## 2023-08-28 DIAGNOSIS — I10 Essential (primary) hypertension: Secondary | ICD-10-CM | POA: Diagnosis not present

## 2023-08-28 DIAGNOSIS — R5383 Other fatigue: Secondary | ICD-10-CM | POA: Diagnosis not present

## 2023-08-28 DIAGNOSIS — E782 Mixed hyperlipidemia: Secondary | ICD-10-CM | POA: Diagnosis not present

## 2023-09-04 DIAGNOSIS — Z23 Encounter for immunization: Secondary | ICD-10-CM | POA: Diagnosis not present

## 2023-09-04 DIAGNOSIS — Z0000 Encounter for general adult medical examination without abnormal findings: Secondary | ICD-10-CM | POA: Diagnosis not present

## 2023-09-04 DIAGNOSIS — I10 Essential (primary) hypertension: Secondary | ICD-10-CM | POA: Diagnosis not present

## 2023-09-04 DIAGNOSIS — R7309 Other abnormal glucose: Secondary | ICD-10-CM | POA: Diagnosis not present

## 2023-09-04 DIAGNOSIS — E782 Mixed hyperlipidemia: Secondary | ICD-10-CM | POA: Diagnosis not present

## 2023-09-18 DIAGNOSIS — Z1211 Encounter for screening for malignant neoplasm of colon: Secondary | ICD-10-CM | POA: Diagnosis not present

## 2023-09-18 DIAGNOSIS — Z1212 Encounter for screening for malignant neoplasm of rectum: Secondary | ICD-10-CM | POA: Diagnosis not present

## 2023-09-23 LAB — EXTERNAL GENERIC LAB PROCEDURE: COLOGUARD: NEGATIVE

## 2023-09-23 LAB — COLOGUARD: COLOGUARD: NEGATIVE

## 2023-12-25 ENCOUNTER — Other Ambulatory Visit: Payer: Self-pay | Admitting: Internal Medicine

## 2023-12-25 DIAGNOSIS — Z1231 Encounter for screening mammogram for malignant neoplasm of breast: Secondary | ICD-10-CM

## 2024-01-09 ENCOUNTER — Ambulatory Visit
Admission: RE | Admit: 2024-01-09 | Discharge: 2024-01-09 | Disposition: A | Discharge status: Home / Self Care | Payer: Medicare HMO | Source: Ambulatory Visit | Attending: Internal Medicine | Admitting: Internal Medicine

## 2024-01-09 DIAGNOSIS — Z1231 Encounter for screening mammogram for malignant neoplasm of breast: Secondary | ICD-10-CM

## 2024-01-18 ENCOUNTER — Other Ambulatory Visit: Payer: Self-pay | Admitting: Medical Genetics

## 2024-01-22 ENCOUNTER — Other Ambulatory Visit (HOSPITAL_COMMUNITY)
Admission: RE | Admit: 2024-01-22 | Discharge: 2024-01-22 | Disposition: A | Discharge status: Home / Self Care | Payer: Self-pay | Source: Ambulatory Visit | Attending: Medical Genetics | Admitting: Medical Genetics

## 2024-02-02 LAB — GENECONNECT MOLECULAR SCREEN: Genetic Analysis Overall Interpretation: NEGATIVE

## 2024-02-21 DIAGNOSIS — M9902 Segmental and somatic dysfunction of thoracic region: Secondary | ICD-10-CM | POA: Diagnosis not present

## 2024-02-21 DIAGNOSIS — M546 Pain in thoracic spine: Secondary | ICD-10-CM | POA: Diagnosis not present

## 2024-02-21 DIAGNOSIS — M9901 Segmental and somatic dysfunction of cervical region: Secondary | ICD-10-CM | POA: Diagnosis not present

## 2024-02-21 DIAGNOSIS — M542 Cervicalgia: Secondary | ICD-10-CM | POA: Diagnosis not present

## 2024-04-28 ENCOUNTER — Encounter (HOSPITAL_COMMUNITY): Payer: Self-pay | Admitting: *Deleted

## 2024-04-28 ENCOUNTER — Ambulatory Visit (HOSPITAL_COMMUNITY)
Admission: EM | Admit: 2024-04-28 | Discharge: 2024-04-28 | Disposition: A | Discharge status: Home / Self Care | Attending: Family Medicine | Admitting: Family Medicine

## 2024-04-28 DIAGNOSIS — B9689 Other specified bacterial agents as the cause of diseases classified elsewhere: Secondary | ICD-10-CM | POA: Diagnosis not present

## 2024-04-28 DIAGNOSIS — J019 Acute sinusitis, unspecified: Secondary | ICD-10-CM

## 2024-04-28 MED ORDER — AMOXICILLIN-POT CLAVULANATE 875-125 MG PO TABS: 1.0000 | ORAL_TABLET | Freq: Two times a day (BID) | ORAL | 0 refills | Status: AC

## 2024-04-28 MED ORDER — AZELASTINE HCL 0.1 % NA SOLN: 2.0000 | Freq: Two times a day (BID) | NASAL | 0 refills | Status: AC

## 2024-04-28 NOTE — ED Triage Notes (Signed)
 C/o facial pain and pressure with nasal congestion over past 10 days. States feels like she will start to improve, but then sxs continue. Has been taking OTC cold meds.

## 2024-04-28 NOTE — ED Provider Notes (Signed)
 [facility]    CSN: [id number] Arrival date & time: 04/28/24  1120      History   Chief Complaint Chief Complaint  Patient presents with   Nasal Congestion    HPI Jennifer Travis is a 68 y.o. female.   Patient presents with nasal congestion, mild cough, sinus pressure/pain x 10 days.  Denies fever, headache, sore throat, chest pain, shortness of breath.  Patient reports she been taking over-the-counter cold medication with minimal relief.  The history is provided by the patient and medical records.    History reviewed. No pertinent past medical history.  There are no active problems to display for this patient.   History reviewed. No pertinent surgical history.  OB History   No obstetric history on file.      Home Medications    Prior to Admission medications   Medication Sig Start Date End Date Taking? Authorizing Provider  amoxicillin -clavulanate (AUGMENTIN ) 875-125 MG tablet Take 1 tablet by mouth every 12 (twelve) hours. 04/28/24  Yes Rosevelt Constable, Lynsie Mcwatters A, NP  azelastine  (ASTELIN ) 0.1 % nasal spray Place 2 sprays into both nostrils 2 (two) times daily. Use in each nostril as directed 04/28/24  Yes Levora Reas A, NP  hydrochlorothiazide  (HYDRODIURIL ) 25 MG tablet Take 1 tablet by mouth daily. 01/25/22  Yes   COVID-19 mRNA bivalent vaccine, Pfizer, (PFIZER COVID-19 VAC BIVALENT) injection Inject into the muscle. 10/25/21   Liane Redman, MD    Family History Family History  Problem Relation Age of Onset   BRCA 1/2 Neg Hx    Breast cancer Neg Hx     Social History Social History   Tobacco Use   Smoking status: Never   Smokeless tobacco: Never  Vaping Use   Vaping status: Never Used  Substance Use Topics   Alcohol use: Not Currently   Drug use: Never     Allergies   Patient has no known allergies.   Review of Systems Review of Systems  Per HPI  Physical Exam Triage Vital Signs ED Triage Vitals  Encounter Vitals Group      BP 04/28/24 1324 (!) 148/88     Systolic BP Percentile --      Diastolic BP Percentile --      Pulse Rate 04/28/24 1313 71     Resp 04/28/24 1313 18     Temp 04/28/24 1313 98.5 F (36.9 C)     Temp Source 04/28/24 1313 Oral     SpO2 04/28/24 1313 95 %     Weight --      Height --      Head Circumference --      Peak Flow --      Pain Score 04/28/24 1319 3     Pain Loc --      Pain Education --      Exclude from Growth Chart --    No data found.  Updated Vital Signs BP (!) 148/88 (BP Location: Left Arm)   Pulse 71   Temp 98.5 F (36.9 C) (Oral)   Resp 18   LMP 03/29/2011   SpO2 95%   Visual Acuity Right Eye Distance:   Left Eye Distance:   Bilateral Distance:    Right Eye Near:   Left Eye Near:    Bilateral Near:     Physical Exam Vitals and nursing note reviewed.  Constitutional:      General: She is awake. She is not in acute distress.    Appearance: Normal  appearance. She is well-developed and well-groomed. She is not ill-appearing.  HENT:     Right Ear: Tympanic membrane, ear canal and external ear normal.     Left Ear: Tympanic membrane, ear canal and external ear normal.     Nose: Congestion and rhinorrhea present.     Right Sinus: Maxillary sinus tenderness and frontal sinus tenderness present.     Mouth/Throat:     Mouth: Mucous membranes are moist.     Pharynx: Posterior oropharyngeal erythema and postnasal drip present. No oropharyngeal exudate.  Cardiovascular:     Rate and Rhythm: Normal rate and regular rhythm.  Pulmonary:     Effort: Pulmonary effort is normal.     Breath sounds: Normal breath sounds.  Lymphadenopathy:     Cervical: Cervical adenopathy present.     Right cervical: Superficial cervical adenopathy present.     Left cervical: Superficial cervical adenopathy present.  Skin:    General: Skin is warm and dry.  Neurological:     Mental Status: She is alert.  Psychiatric:        Behavior: Behavior is cooperative.      UC  Treatments / Results  Labs (all labs ordered are listed, but only abnormal results are displayed) Labs Reviewed - No data to display  EKG   Radiology No results found.  Procedures Procedures (including critical care time)  Medications Ordered in UC Medications - No data to display  Initial Impression / Assessment and Plan / UC Course  I have reviewed the triage vital signs and the nursing notes.  Pertinent labs & imaging results that were available during my care of the patient were reviewed by me and considered in my medical decision making (see chart for details).     Patient is well-appearing.  Vitals are stable.  Upon assessment congestion and rhinorrhea present, mild erythema and PND noted to pharynx.  Right sided maxillary and frontal sinus tenderness noted.  Bilateral superficial cervical adenopathy noted on exam.  Prescribed Augmentin  for bacterial sinusitis.  Prescribed azelastine  nasal spray to assist with congestion.  Discussed over-the-counter medications for symptoms.  Discussed return precautions. Final Clinical Impressions(s) / UC Diagnoses   Final diagnoses:  Acute bacterial sinusitis     Discharge Instructions      Starting Augmentin  twice daily for 7 days for sinus infection.  Be sure to take this with this with food as this can upset your stomach. Use azelastine  nasal spray twice daily to help with congestion. Recommend Mucinex for cough and congestion. Return here if symptoms persist or worsen.  ED Prescriptions     Medication Sig Dispense Auth. Provider   amoxicillin -clavulanate (AUGMENTIN ) 875-125 MG tablet Take 1 tablet by mouth every 12 (twelve) hours. 14 tablet Levora Reas A, NP   azelastine  (ASTELIN ) 0.1 % nasal spray Place 2 sprays into both nostrils 2 (two) times daily. Use in each nostril as directed 30 mL Levora Reas A, NP      PDMP not reviewed this encounter.   Levora Reas A, NP 04/28/24 [id number]

## 2024-04-28 NOTE — Discharge Instructions (Signed)
 Starting Augmentin  twice daily for 7 days for sinus infection.  Be sure to take this with this with food as this can upset your stomach. Use azelastine  nasal spray twice daily to help with congestion. Recommend Mucinex for cough and congestion. Return here if symptoms persist or worsen.

## 2024-05-20 DIAGNOSIS — M546 Pain in thoracic spine: Secondary | ICD-10-CM | POA: Diagnosis not present

## 2024-05-20 DIAGNOSIS — M9902 Segmental and somatic dysfunction of thoracic region: Secondary | ICD-10-CM | POA: Diagnosis not present

## 2024-05-20 DIAGNOSIS — M542 Cervicalgia: Secondary | ICD-10-CM | POA: Diagnosis not present

## 2024-05-20 DIAGNOSIS — M9901 Segmental and somatic dysfunction of cervical region: Secondary | ICD-10-CM | POA: Diagnosis not present

## 2024-08-19 DIAGNOSIS — M542 Cervicalgia: Secondary | ICD-10-CM | POA: Diagnosis not present

## 2024-08-19 DIAGNOSIS — M9901 Segmental and somatic dysfunction of cervical region: Secondary | ICD-10-CM | POA: Diagnosis not present

## 2024-08-19 DIAGNOSIS — M546 Pain in thoracic spine: Secondary | ICD-10-CM | POA: Diagnosis not present

## 2024-08-19 DIAGNOSIS — M9902 Segmental and somatic dysfunction of thoracic region: Secondary | ICD-10-CM | POA: Diagnosis not present

## 2024-09-16 DIAGNOSIS — E782 Mixed hyperlipidemia: Secondary | ICD-10-CM | POA: Diagnosis not present

## 2024-09-16 DIAGNOSIS — R5383 Other fatigue: Secondary | ICD-10-CM | POA: Diagnosis not present

## 2024-09-16 DIAGNOSIS — I10 Essential (primary) hypertension: Secondary | ICD-10-CM | POA: Diagnosis not present

## 2024-09-16 DIAGNOSIS — R7309 Other abnormal glucose: Secondary | ICD-10-CM | POA: Diagnosis not present

## 2024-09-16 DIAGNOSIS — Z0000 Encounter for general adult medical examination without abnormal findings: Secondary | ICD-10-CM | POA: Diagnosis not present

## 2024-09-23 DIAGNOSIS — R7309 Other abnormal glucose: Secondary | ICD-10-CM | POA: Diagnosis not present

## 2024-09-23 DIAGNOSIS — I10 Essential (primary) hypertension: Secondary | ICD-10-CM | POA: Diagnosis not present

## 2024-09-23 DIAGNOSIS — E782 Mixed hyperlipidemia: Secondary | ICD-10-CM | POA: Diagnosis not present

## 2024-09-23 DIAGNOSIS — E7841 Elevated Lipoprotein(a): Secondary | ICD-10-CM | POA: Diagnosis not present

## 2024-09-23 DIAGNOSIS — Z0000 Encounter for general adult medical examination without abnormal findings: Secondary | ICD-10-CM | POA: Diagnosis not present

## 2024-10-07 DIAGNOSIS — H5203 Hypermetropia, bilateral: Secondary | ICD-10-CM | POA: Diagnosis not present

## 2024-10-07 DIAGNOSIS — H31002 Unspecified chorioretinal scars, left eye: Secondary | ICD-10-CM | POA: Diagnosis not present

## 2024-10-14 DIAGNOSIS — Z0100 Encounter for examination of eyes and vision without abnormal findings: Secondary | ICD-10-CM | POA: Diagnosis not present

## 2024-11-11 DIAGNOSIS — E782 Mixed hyperlipidemia: Secondary | ICD-10-CM | POA: Diagnosis not present

## 2024-11-11 DIAGNOSIS — Z0000 Encounter for general adult medical examination without abnormal findings: Secondary | ICD-10-CM | POA: Diagnosis not present

## 2024-11-11 DIAGNOSIS — I10 Essential (primary) hypertension: Secondary | ICD-10-CM | POA: Diagnosis not present

## 2024-11-11 DIAGNOSIS — R7309 Other abnormal glucose: Secondary | ICD-10-CM | POA: Diagnosis not present

## 2024-11-20 DIAGNOSIS — I10 Essential (primary) hypertension: Secondary | ICD-10-CM | POA: Diagnosis not present

## 2024-11-20 DIAGNOSIS — E782 Mixed hyperlipidemia: Secondary | ICD-10-CM | POA: Diagnosis not present

## 2024-11-20 DIAGNOSIS — R7309 Other abnormal glucose: Secondary | ICD-10-CM | POA: Diagnosis not present

## 2024-11-20 DIAGNOSIS — E7841 Elevated Lipoprotein(a): Secondary | ICD-10-CM | POA: Diagnosis not present

## 2025-01-06 NOTE — Progress Notes (Signed)
 CAIDANCE SYBERT                                          MRN: [id number]   01/06/2025   The [facility] Quality Team Specialist reviewed this patient medical record for the purposes of chart review for care gap closure. The following were reviewed: abstraction for care gap closure-controlling blood pressure.    [facility] Quality Team

## 2025-01-07 ENCOUNTER — Other Ambulatory Visit: Payer: Self-pay | Admitting: Internal Medicine

## 2025-01-07 DIAGNOSIS — Z1231 Encounter for screening mammogram for malignant neoplasm of breast: Secondary | ICD-10-CM

## 2025-01-15 ENCOUNTER — Ambulatory Visit
Admission: RE | Admit: 2025-01-15 | Discharge: 2025-01-15 | Disposition: A | Source: Ambulatory Visit | Attending: Internal Medicine | Admitting: Internal Medicine

## 2025-01-15 DIAGNOSIS — Z1231 Encounter for screening mammogram for malignant neoplasm of breast: Secondary | ICD-10-CM
